# Patient Record
Sex: Male | Born: 1995 | Race: White | Hispanic: No | Marital: Single | Smoking: Current every day smoker
Health system: Southern US, Community
[De-identification: ages and names within clinical notes are randomized; demographics above are authoritative.]

## PROBLEM LIST (undated history)

## (undated) DIAGNOSIS — J039 Acute tonsillitis, unspecified: Secondary | ICD-10-CM

## (undated) HISTORY — PX: ELBOW FRACTURE SURGERY: SHX616

---

## 2018-01-16 ENCOUNTER — Emergency Department (HOSPITAL_COMMUNITY)
Admission: EM | Admit: 2018-01-16 | Discharge: 2018-01-16 | Disposition: A | Discharge status: Home / Self Care | Payer: Self-pay | Attending: Emergency Medicine | Admitting: Emergency Medicine

## 2018-01-16 ENCOUNTER — Other Ambulatory Visit: Payer: Self-pay

## 2018-01-16 ENCOUNTER — Encounter (HOSPITAL_COMMUNITY): Payer: Self-pay | Admitting: Emergency Medicine

## 2018-01-16 DIAGNOSIS — J029 Acute pharyngitis, unspecified: Secondary | ICD-10-CM | POA: Insufficient documentation

## 2018-01-16 DIAGNOSIS — F17210 Nicotine dependence, cigarettes, uncomplicated: Secondary | ICD-10-CM | POA: Insufficient documentation

## 2018-01-16 HISTORY — DX: Acute tonsillitis, unspecified: J03.90

## 2018-01-16 LAB — GROUP A STREP BY PCR: Group A Strep by PCR: NOT DETECTED

## 2018-01-16 MED ORDER — MAGIC MOUTHWASH W/LIDOCAINE: 10.0000 mL | Freq: Four times a day (QID) | ORAL | 0 refills | Status: AC | PRN

## 2018-01-16 MED ORDER — IBUPROFEN 800 MG PO TABS: 800.0000 mg | ORAL_TABLET | Freq: Three times a day (TID) | ORAL | 0 refills | Status: DC

## 2018-01-16 MED ORDER — IBUPROFEN 800 MG PO TABS
800.0000 mg | ORAL_TABLET | Freq: Once | ORAL | Status: AC
  Administered 2018-01-16: 800 mg via ORAL
  Filled 2018-01-16: qty 1

## 2018-01-16 NOTE — ED Triage Notes (Signed)
Pt c/o intermittent sore throat without fever x 2 weeks.

## 2018-01-16 NOTE — Discharge Instructions (Addendum)
Your strep test is negative today, I suspect your sore throat may be from a viral infection which should get better over the next 4-5 days if not sooner.  You may take the medicines prescribed to help with pain.  If you detect a pattern of recurrent sore throat when you drink your homes tap water, you may need to consider having your water tested as discussed.  [place] water department may be able to help with this.  However,  if no other household have symptoms, this may simply be a coincidence and have nothing to do with your homes water quality.

## 2018-01-16 NOTE — ED Provider Notes (Signed)
[facility] EMERGENCY DEPARTMENT Provider Note   CSN: [id number] Arrival date & time: 01/16/18  1629     History   Chief Complaint Chief Complaint  Patient presents with  . Sore Throat    HPI Arthur Cummings is a 22 y.o. male with no significant past medical history presenting with a one week history of intermittent sore throat.  he reports one week ago having severe sore throat which was present for 3 days, then improved until yesterday when it returned.  He has noticed a pattern of pain occurring after drinking tap water at his home (he lives in a 21 year old home, but has lived there for years and no prior problems with the water which is [employer] [place] water.  No other household members with sx.  He also states he had frequent tonsillitis as a child.  He denies fevers, chills, nasal congestion, rhinorrhea or PND and denies sob, cp, has had a mild non productive cough. He has had no medicines for treatment prior to arrival.  The history is provided by the patient.    Past Medical History:  Diagnosis Date  . Tonsillitis     There are no active problems to display for this patient.   Past Surgical History:  Procedure Laterality Date  . ELBOW FRACTURE SURGERY          Home Medications    Prior to Admission medications   Medication Sig Start Date End Date Taking? Authorizing Provider  ibuprofen (ADVIL,MOTRIN) 800 MG tablet Take 1 tablet (800 mg total) by mouth 3 (three) times daily. 01/16/18   Burgess Amor, PA-C  magic mouthwash w/lidocaine SOLN Take 10 mLs by mouth 4 (four) times daily as needed (throat pain). Note to pharmacy - equal parts diphendydramine, aluminum hydroxide and lidocaine HCL 01/16/18   Burgess Amor, PA-C    Family History No family history on file.  Social History Social History   Tobacco Use  . Smoking status: Current Every Day Smoker    Packs/day: 1.00    Types: Cigarettes  . Smokeless tobacco: Never Used  Substance Use Topics  . Alcohol  use: Not Currently  . Drug use: Never     Allergies   Patient has no known allergies.   Review of Systems Review of Systems  Constitutional: Negative for chills and fever.  HENT: Positive for sore throat. Negative for congestion, ear discharge, ear pain, mouth sores, rhinorrhea, sinus pressure, trouble swallowing and voice change.   Eyes: Negative for discharge.  Respiratory: Positive for cough. Negative for chest tightness, shortness of breath, wheezing and stridor.   Cardiovascular: Negative for chest pain.  Gastrointestinal: Negative for abdominal pain.  Genitourinary: Negative.      Physical Exam Updated Vital Signs BP 130/78   Pulse 75   Temp 99 F (37.2 C) (Oral)   Resp 16   Ht 6\' 1"  (1.854 m)   Wt 79.4 kg (175 lb)   SpO2 99%   BMI 23.09 kg/m   Physical Exam  Constitutional: He is oriented to person, place, and time. He appears well-developed and well-nourished.  HENT:  Head: Normocephalic and atraumatic.  Right Ear: Tympanic membrane and ear canal normal.  Left Ear: Tympanic membrane and ear canal normal.  Nose: No mucosal edema or rhinorrhea.  Mouth/Throat: Uvula is midline and mucous membranes are normal. No trismus in the jaw. Normal dentition. No uvula swelling. Posterior oropharyngeal erythema present. No oropharyngeal exudate, posterior oropharyngeal edema or tonsillar abscesses. Tonsils are 1+ on the  right. Tonsils are 1+ on the left. No tonsillar exudate.  Eyes: Conjunctivae are normal.  Neck: Phonation normal. No neck rigidity.  No stridor  Cardiovascular: Normal rate and normal heart sounds.  Pulmonary/Chest: Effort normal. No respiratory distress. He has no wheezes. He has no rales.  Musculoskeletal: Normal range of motion.  Neurological: He is alert and oriented to person, place, and time.  Skin: Skin is warm and dry. No rash noted.  Psychiatric: He has a normal mood and affect.     ED Treatments / Results  Labs (all labs ordered are listed,  but only abnormal results are displayed) Labs Reviewed  GROUP A STREP BY PCR    EKG None  Radiology No results found.  Procedures Procedures (including critical care time)  Medications Ordered in ED Medications  ibuprofen (ADVIL,MOTRIN) tablet 800 mg (800 mg Oral Given 01/16/18 1710)     Initial Impression / Assessment and Plan / ED Course  I have reviewed the triage vital signs and the nursing notes.  Pertinent labs & imaging results that were available during my care of the patient were reviewed by me and considered in my medical decision making (see chart for details).     Strep negative, suspect viral infection, ibuprofen, magic mouthwash.  Pt concern re homes water quality could be an issue, advised discuss with his water dept for water testing if sx persist.  Ibuprofen, magic mouthwash.  Strep cx pending.  No stridor, no sob, no acute distress.  The patient appears reasonably screened and/or stabilized for discharge and I doubt any other medical condition or other [facility] requiring further screening, evaluation, or treatment in the ED at this time prior to discharge.   Final Clinical Impressions(s) / ED Diagnoses   Final diagnoses:  Viral pharyngitis    ED Discharge Orders        Ordered    ibuprofen (ADVIL,MOTRIN) 800 MG tablet  3 times daily     01/16/18 1812    magic mouthwash w/lidocaine SOLN  4 times daily PRN     01/16/18 1812       Burgess Amordol, Kysean Sweet, PA-C 01/16/18 1820    Long, Arlyss RepressJoshua G, MD 01/16/18 1919

## 2018-10-09 ENCOUNTER — Other Ambulatory Visit: Payer: Self-pay

## 2018-10-09 ENCOUNTER — Emergency Department (HOSPITAL_COMMUNITY): Payer: Self-pay

## 2018-10-09 ENCOUNTER — Emergency Department (HOSPITAL_COMMUNITY)
Admission: EM | Admit: 2018-10-09 | Discharge: 2018-10-09 | Disposition: A | Discharge status: Home / Self Care | Payer: Self-pay | Attending: Emergency Medicine | Admitting: Emergency Medicine

## 2018-10-09 ENCOUNTER — Encounter (HOSPITAL_COMMUNITY): Payer: Self-pay | Admitting: Emergency Medicine

## 2018-10-09 DIAGNOSIS — Y93H3 Activity, building and construction: Secondary | ICD-10-CM | POA: Insufficient documentation

## 2018-10-09 DIAGNOSIS — M7051 Other bursitis of knee, right knee: Secondary | ICD-10-CM | POA: Insufficient documentation

## 2018-10-09 DIAGNOSIS — F17210 Nicotine dependence, cigarettes, uncomplicated: Secondary | ICD-10-CM | POA: Insufficient documentation

## 2018-10-09 MED ORDER — IBUPROFEN 800 MG PO TABS: 800.0000 mg | ORAL_TABLET | Freq: Three times a day (TID) | ORAL | 0 refills | Status: AC

## 2018-10-09 MED ORDER — IBUPROFEN 800 MG PO TABS
800.0000 mg | ORAL_TABLET | Freq: Once | ORAL | Status: AC
  Administered 2018-10-09: 800 mg via ORAL
  Filled 2018-10-09: qty 1

## 2018-10-09 NOTE — ED Provider Notes (Signed)
[facility] EMERGENCY DEPARTMENT Provider Note   CSN: [id number] Arrival date & time: 10/09/18  1152    History   Chief Complaint Chief Complaint  Patient presents with  . Knee Pain    right    HPI Arthur Cummings is a 23 y.o. male presenting with redness and tenderness of his right knee which he woke with today.  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  His pain is worsened with palpation and pressure, better at rest.  He has found no alleviators and has had no tx prior to arrival.     The history is provided by the patient.    Past Medical History:  Diagnosis Date  . Tonsillitis     There are no active problems to display for this patient.   Past Surgical History:  Procedure Laterality Date  . ELBOW FRACTURE SURGERY          Home Medications    Prior to Admission medications   Medication Sig Start Date End Date Taking? Authorizing Provider  ibuprofen (ADVIL,MOTRIN) 800 MG tablet Take 1 tablet (800 mg total) by mouth 3 (three) times daily. 10/09/18   Burgess Amor, PA-C  magic mouthwash w/lidocaine SOLN Take 10 mLs by mouth 4 (four) times daily as needed (throat pain). Note to pharmacy - equal parts diphendydramine, aluminum hydroxide and lidocaine HCL 01/16/18   Burgess Amor, PA-C    Family History History reviewed. No pertinent family history.  Social History Social History   Tobacco Use  . Smoking status: Current Every Day Smoker    Packs/day: 1.00    Types: Cigarettes  . Smokeless tobacco: Never Used  Substance Use Topics  . Alcohol use: Not Currently  . Drug use: Never     Allergies   Patient has no known allergies.   Review of Systems Review of Systems  Constitutional: Negative for fever.  Musculoskeletal: Positive for arthralgias. Negative for joint swelling and myalgias.  Skin: Positive for color change.  Neurological: Negative for weakness and numbness.     Physical  Exam Updated Vital Signs BP (!) 121/55 (BP Location: Right Arm)   Pulse 62   Temp 98 F (36.7 C) (Oral)   Resp 15   Ht 6\' 1"  (1.854 m)   Wt 77.1 kg   SpO2 99%   BMI 22.43 kg/m   Physical Exam Constitutional:      Appearance: He is well-developed.  HENT:     Head: Atraumatic.  Neck:     Musculoskeletal: Normal range of motion.  Cardiovascular:     Comments: Pulses equal bilaterally Musculoskeletal: Normal range of motion.        General: Tenderness present.     Right knee: He exhibits swelling and erythema. He exhibits normal range of motion, no effusion, no ecchymosis, no deformity and normal meniscus.     Comments: ttp with mild erythema and edema over right tibial tuberosity. No pain or edema over the patellar ligament. No knee joint pain or effusion. Pt can SLR with right knee in extension, no pain or collapse. Skin intact, no injury noted.   Skin:    General: Skin is warm and dry.  Neurological:     Mental Status: He is alert.     Sensory: No sensory deficit.     Deep Tendon Reflexes: Reflexes normal.      ED Treatments / Results  Labs (all labs ordered are listed, but only abnormal  results are displayed) Labs Reviewed - No data to display  EKG None  Radiology Dg Knee Complete 4 Views Right  Result Date: 10/09/2018 CLINICAL DATA:  Acute right knee pain and swelling after kick boxing yesterday. EXAM: RIGHT KNEE - COMPLETE 4+ VIEW COMPARISON:  None. FINDINGS: No evidence of fracture, dislocation, or joint effusion. No evidence of arthropathy or other focal bone abnormality. Soft tissues are unremarkable. IMPRESSION: Negative. Electronically Signed   By: Lupita Raider, M.D.   On: 10/09/2018 12:56    Procedures Procedures (including critical care time)  Medications Ordered in ED Medications  ibuprofen (ADVIL,MOTRIN) tablet 800 mg (800 mg Oral Given 10/09/18 1436)     Initial Impression / Assessment and Plan / ED Course  I have reviewed the triage vital  signs and the nursing notes.  Pertinent labs & imaging results that were available during my care of the patient were reviewed by me and considered in my medical decision making (see chart for details).        Pt with hx and exam suggesting bursitis of the right tibial tuberosity. No trauma, no hx or exam findings suggesting infection. Discussed tx including ice, nsaids, avoiding direct pressure while this site heals and to cushion his knees in the future while performing job duties.  He was given referral to ortho for further eval if sx persist or worsen.   Final Clinical Impressions(s) / ED Diagnoses   Final diagnoses:  Infrapatellar bursitis of right knee    ED Discharge Orders         Ordered    ibuprofen (ADVIL,MOTRIN) 800 MG tablet  3 times daily     10/09/18 1426           Burgess Amor, Cordelia Poche 10/10/18 1130    Geoffery Lyons, MD 10/10/18 1413

## 2018-10-09 NOTE — ED Triage Notes (Signed)
C/o right knee pain.  Denies injury  Rates pain 5/10.

## 2020-09-14 IMAGING — CR RIGHT KNEE - COMPLETE 4+ VIEW
4 series · 4 of 4 positions shown · non-contrast
Comparison: None.

CLINICAL DATA: Acute right knee pain and swelling after [place]ue[place] boxing
yesterday.

EXAM:
RIGHT KNEE - COMPLETE 4+ VIEW

[t knee ap right]
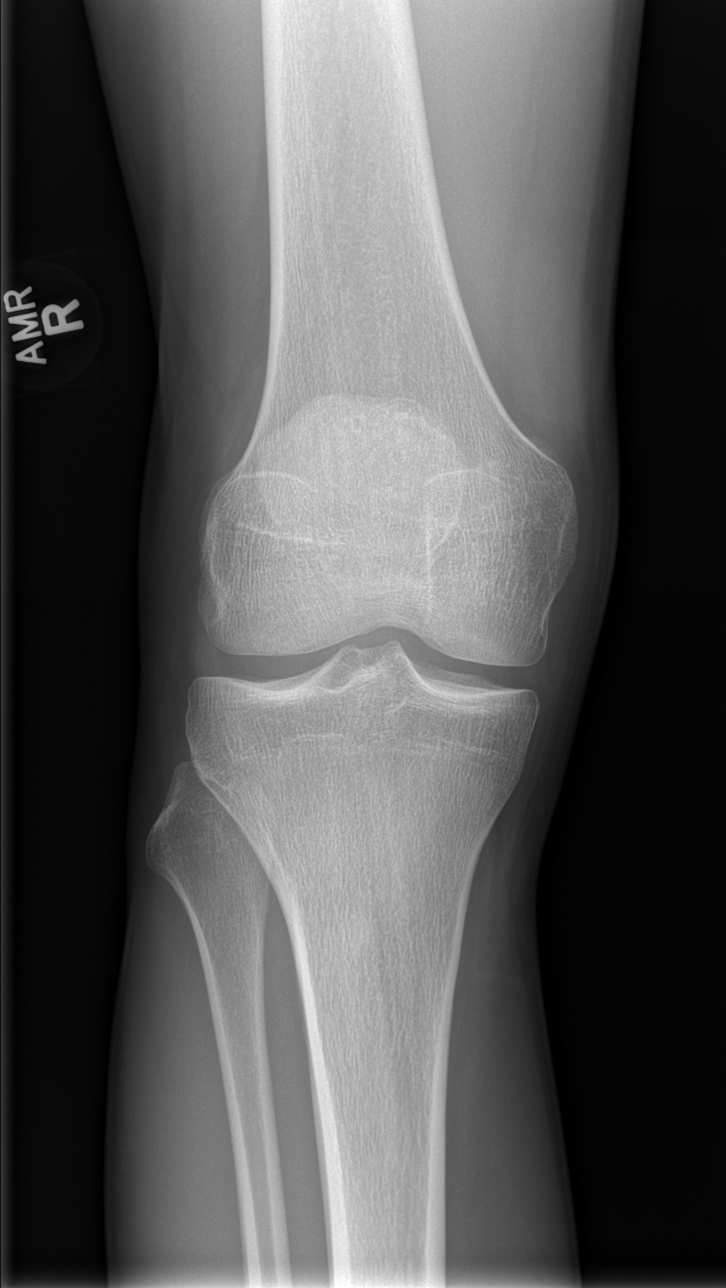

[t knee tunnel right (1 of 2)]
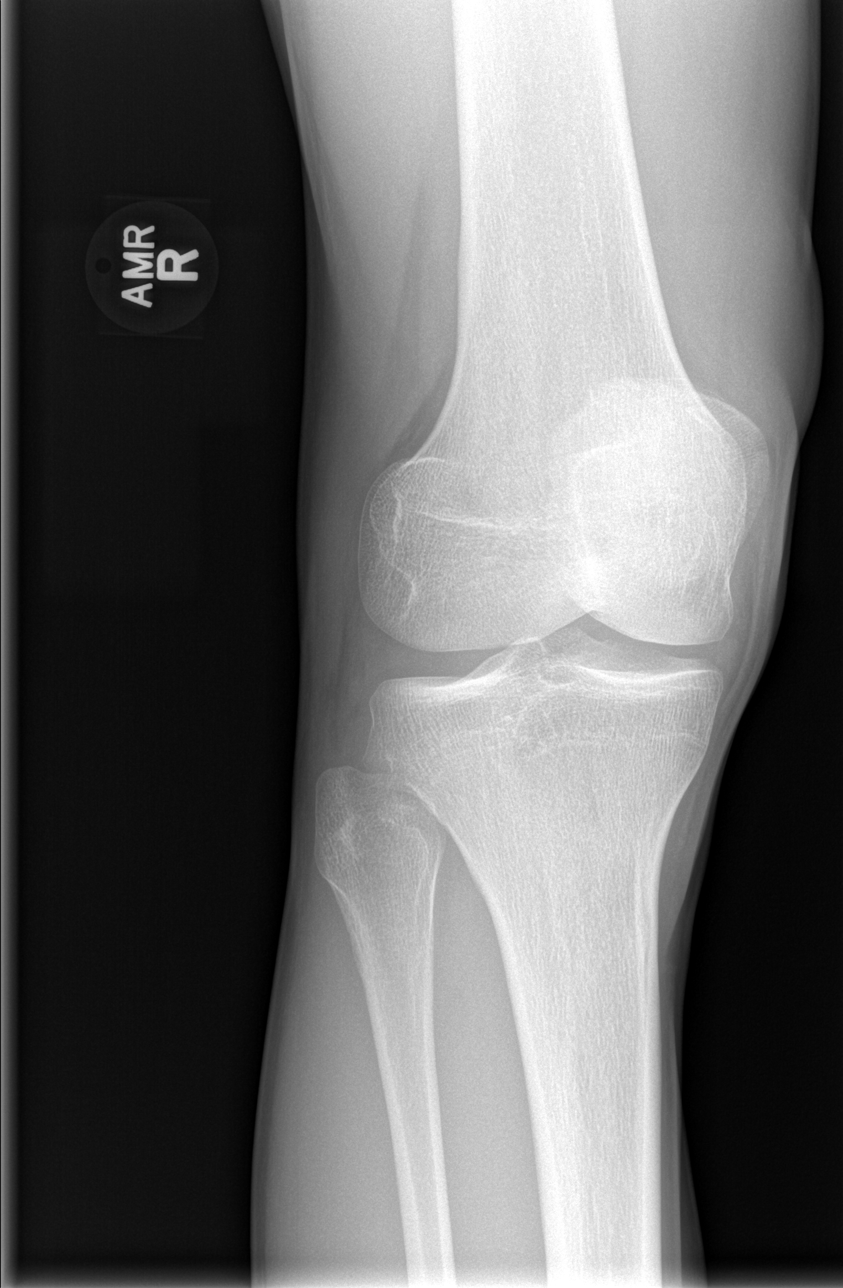

[t knee tunnel right (2 of 2)]
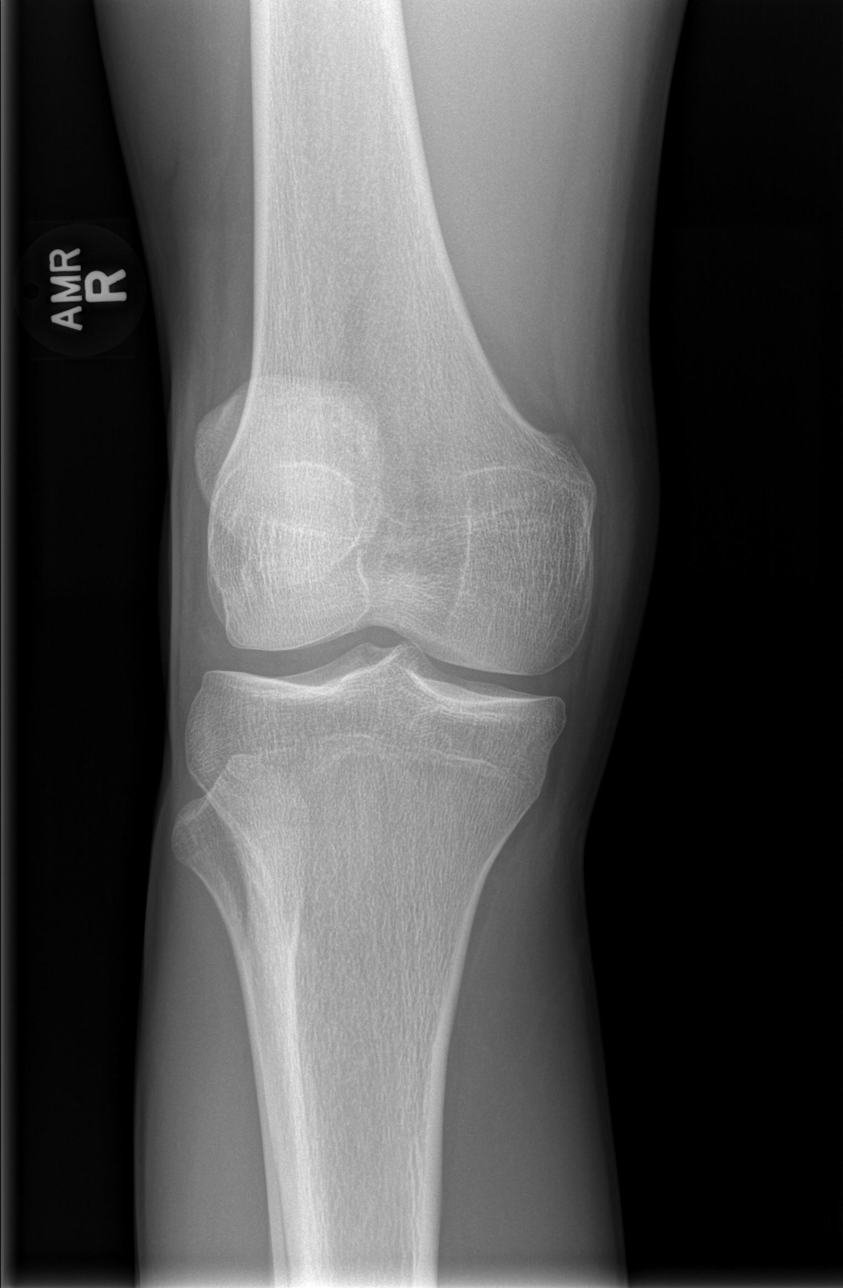

[t knee lat right]
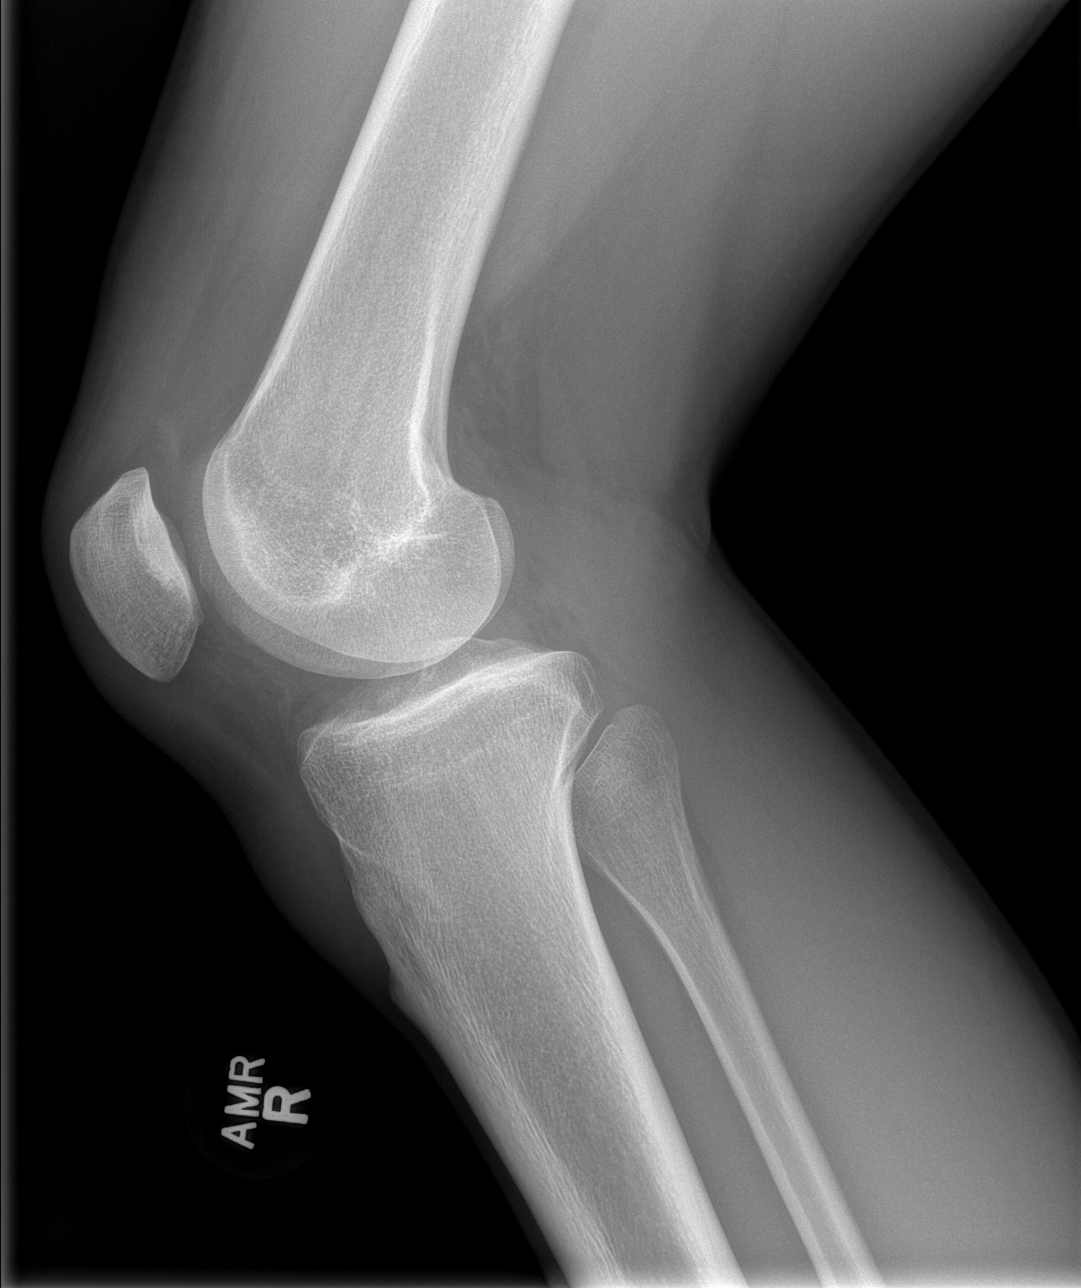

[4 of 4 positions shown; findings below may reference images not displayed]

FINDINGS: No evidence of fracture, dislocation, or joint effusion. No evidence
of arthropathy or other focal bone abnormality. Soft tissues are
unremarkable.
IMPRESSION: Negative.
